# Patient Record
Sex: Male | Born: 2000 | Race: Black or African American | Hispanic: No | Marital: Single | Smoking: Never smoker
Health system: Southern US, Community
[De-identification: ages and names within clinical notes are randomized; demographics above are authoritative.]

---
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
## 2019-03-30 ENCOUNTER — Other Ambulatory Visit: Payer: Self-pay

## 2019-03-30 ENCOUNTER — Encounter (HOSPITAL_COMMUNITY): Payer: Self-pay | Admitting: Emergency Medicine

## 2019-03-30 ENCOUNTER — Emergency Department (HOSPITAL_COMMUNITY)
Admission: EM | Admit: 2019-03-30 | Discharge: 2019-03-30 | Disposition: A | Discharge status: Home / Self Care | Payer: 59 | Attending: Emergency Medicine | Admitting: Emergency Medicine

## 2019-03-30 DIAGNOSIS — S01112A Laceration without foreign body of left eyelid and periocular area, initial encounter: Secondary | ICD-10-CM | POA: Diagnosis not present

## 2019-03-30 DIAGNOSIS — W503XXA Accidental bite by another person, initial encounter: Secondary | ICD-10-CM

## 2019-03-30 DIAGNOSIS — F17220 Nicotine dependence, chewing tobacco, uncomplicated: Secondary | ICD-10-CM | POA: Insufficient documentation

## 2019-03-30 DIAGNOSIS — Y929 Unspecified place or not applicable: Secondary | ICD-10-CM | POA: Diagnosis not present

## 2019-03-30 DIAGNOSIS — Z23 Encounter for immunization: Secondary | ICD-10-CM | POA: Insufficient documentation

## 2019-03-30 DIAGNOSIS — Y999 Unspecified external cause status: Secondary | ICD-10-CM | POA: Diagnosis not present

## 2019-03-30 DIAGNOSIS — Y939 Activity, unspecified: Secondary | ICD-10-CM | POA: Diagnosis not present

## 2019-03-30 DIAGNOSIS — S0993XA Unspecified injury of face, initial encounter: Secondary | ICD-10-CM | POA: Diagnosis present

## 2019-03-30 DIAGNOSIS — S0181XA Laceration without foreign body of other part of head, initial encounter: Secondary | ICD-10-CM

## 2019-03-30 MED ORDER — TETANUS-DIPHTH-ACELL PERTUSSIS 5-2.5-18.5 LF-MCG/0.5 IM SUSP
0.5000 mL | Freq: Once | INTRAMUSCULAR | Status: AC
  Administered 2019-03-30: 07:00:00 0.5 mL via INTRAMUSCULAR
  Filled 2019-03-30: qty 0.5

## 2019-03-30 MED ORDER — AMOXICILLIN-POT CLAVULANATE 875-125 MG PO TABS: 1.0000 | ORAL_TABLET | Freq: Two times a day (BID) | ORAL | 0 refills | Status: DC

## 2019-03-30 MED ORDER — LIDOCAINE-EPINEPHRINE (PF) 2 %-1:200000 IJ SOLN
20.0000 mL | Freq: Once | INTRAMUSCULAR | Status: AC
  Administered 2019-03-30: 10 mL via INTRADERMAL
  Filled 2019-03-30: qty 20

## 2019-03-30 NOTE — ED Notes (Signed)
Pt wants to stay in personal clothes and not change into gown. Pt placed on cardiac monitoring due to heart rate.
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
## 2019-03-30 NOTE — ED Notes (Signed)
ED Provider at bedside. 

## 2019-03-30 NOTE — Discharge Instructions (Signed)
It was my pleasure taking care of you today!   Please take all of your antibiotics until finished!  For your laceration, these stitches should dissolve on their own.  If they do not, you can take a warm washcloth and some Vaseline to gently scrub the area to remove suturing.  Watch for signs of infection such as surrounding redness or drainage from the wound.  If this occurs, see her primary care doctor or return to the emergency department.  For your bite wound, keep clean, washing with soap and water several times a day.  Ice to help with the swelling.

## 2021-11-29 ENCOUNTER — Other Ambulatory Visit: Payer: Self-pay

## 2021-11-29 ENCOUNTER — Emergency Department (HOSPITAL_COMMUNITY): Payer: Managed Care, Other (non HMO)

## 2021-11-29 ENCOUNTER — Emergency Department (HOSPITAL_COMMUNITY)
Admission: EM | Admit: 2021-11-29 | Discharge: 2021-11-29 | Disposition: A | Discharge status: Home / Self Care | Payer: Managed Care, Other (non HMO) | Attending: Emergency Medicine | Admitting: Emergency Medicine

## 2021-11-29 ENCOUNTER — Encounter (HOSPITAL_COMMUNITY): Payer: Self-pay

## 2021-11-29 DIAGNOSIS — M79641 Pain in right hand: Secondary | ICD-10-CM | POA: Diagnosis not present

## 2021-11-29 DIAGNOSIS — R030 Elevated blood-pressure reading, without diagnosis of hypertension: Secondary | ICD-10-CM | POA: Diagnosis not present

## 2021-11-29 DIAGNOSIS — M25531 Pain in right wrist: Secondary | ICD-10-CM | POA: Insufficient documentation

## 2021-11-29 DIAGNOSIS — W2189XA Striking against or struck by other sports equipment, initial encounter: Secondary | ICD-10-CM | POA: Insufficient documentation

## 2021-11-29 NOTE — ED Provider Notes (Signed)
?[facility] DEPT ?Provider Note ? ? ?CSN: [id number] ?Arrival date & time: 11/29/21  0121 ? ?  ? ?History ? ?Chief Complaint  ?Patient presents with  ? Hand Pain  ? Wrist Injury  ? ? ?Garrett Hess is a 21 y.o. male with no past medical history who presents today for evaluation of a right hand injury. ?He states that he was punching a punching dummy to blow off some steam after work and injured his right hand and wrist.  He initially did not think his injury was significant however as the night is gone on his pain is increased.  He denies any other injuries. ? ?HPI ? ?  ? ?Home Medications ?Prior to Admission medications   ?Not on File  ?   ? ?Allergies    ?Patient has no known allergies.   ? ?Review of Systems   ?Review of Systems ? ?Physical Exam ?Updated Vital Signs ?BP (!) 165/93 (BP Location: Left Arm)   Pulse 82   Temp 98 ?F (36.7 ?C) (Oral)   Resp 17   Ht 5\' 9"  (1.753 m)   Wt 74.8 kg   SpO2 100%   BMI 24.37 kg/m?  ?Physical Exam ?Vitals and nursing note reviewed.  ?Constitutional:   ?   General: He is not in acute distress. ?HENT:  ?   Head: Normocephalic and atraumatic.  ?Cardiovascular:  ?   Rate and Rhythm: Normal rate.  ?   Comments: Capillary refill to all fingers on right hand is under 2 seconds.  2+ right radial pulse. ?Pulmonary:  ?   Effort: Pulmonary effort is normal. No respiratory distress.  ?Musculoskeletal:  ?   Cervical back: No rigidity.  ?   Comments: Mild edema over the dorsum of the right wrist.  There is no discrete tenderness to palpation over the right anatomic snuffbox.  Compartments in the right forearm and arm are soft and easily compressible.  Patient has full active range of motion of all fingers on the right hand.  Range of motion of the right wrist is limited secondary to pain.  ?Neurological:  ?   Mental Status: He is alert. Mental status is at baseline.  ?   Comments: Awake and alert, answers all questions appropriately.  Speech is not  slurred.    ?Psychiatric:     ?   Mood and Affect: Mood normal.  ? ? ?ED Results / Procedures / Treatments   ?Labs ?(all labs ordered are listed, but only abnormal results are displayed) ?Labs Reviewed - No data to display ? ?EKG ?None ? ?Radiology ?DG Wrist Complete Right ? ?Result Date: 11/29/2021 ?CLINICAL DATA:  Injured hand hitting punching bag EXAM: RIGHT WRIST - COMPLETE 3+ VIEW COMPARISON:  Hand series today FINDINGS: There is no evidence of fracture or dislocation. There is no evidence of arthropathy or other focal bone abnormality. Soft tissues are unremarkable. IMPRESSION: Negative. Electronically Signed   By: Rolm Baptise M.D.   On: 11/29/2021 02:55  ? ?DG Hand Complete Right ? ?Result Date: 11/29/2021 ?CLINICAL DATA:  Injury hitting punching bag EXAM: RIGHT HAND - COMPLETE 3+ VIEW COMPARISON:  Wrist series today FINDINGS: There is no evidence of fracture or dislocation. There is no evidence of arthropathy or other focal bone abnormality. Soft tissues are unremarkable. IMPRESSION: Negative. Electronically Signed   By: Rolm Baptise M.D.   On: 11/29/2021 02:55   ? ?Procedures ?Procedures  ? ? ?Medications Ordered in ED ?Medications - No data to display ? ?  ED Course/ Medical Decision Making/ A&P ?  ?                        ?Medical Decision Making ?Patient is a 20 year old who presents today for evaluation of a isolated injury to his right hand and wrist.  This is from punching a punching bag per his report. ?On exam he has mild edema.  X-rays of the right hand and wrist were obtained without fracture or other acute abnormalities. ?He is neurovascularly intact on my exam. ?I discussed treatment options with patient. ?Recommended OTC medications as needed for pain.  Follow-up with hand if symptoms do not begin to improve in 1 week.  Ace wrap given for pain. ? ?Of note patient's blood pressure was initially low and then high while in the emergency room.  He is not having any symptoms related to this and states  that normally when he goes to the doctor his blood pressure is elevated.  He does not have a primary care doctor currently. ?I recommended that he obtain 1 and get his blood pressure rechecked in the next month.  He states his understanding. ? ?Return precautions were discussed with patient who states their understanding.  At the time of discharge patient denied any unaddressed complaints or concerns.  Patient is agreeable for discharge home. ? ?Note: Portions of this report may have been transcribed using voice recognition software. Every effort was made to ensure accuracy; however, inadvertent computerized transcription errors may be present ? ? ? ?Amount and/or Complexity of Data Reviewed ?Radiology: ordered. ? ?Risk ?OTC drugs. ?Decision regarding hospitalization. ? ? ? ? ? ? ? ? ? ? ?Final Clinical Impression(s) / ED Diagnoses ?Final diagnoses:  ?Right hand pain  ?Right wrist pain  ?Elevated blood pressure reading  ? ? ?Rx / DC Orders ?ED Discharge Orders   ? ? None  ? ?  ? ? ?  ?Lorin Glass, [place] ?11/29/21 [id number] ? ?  Veatrice Kells, MD ?11/29/21 [id number] ? ?

## 2021-11-29 NOTE — ED Triage Notes (Signed)
Pt punched a punching dummy and injured his right wrist. Pt complains of pain in the wrist and up the right side of the hand.  ?

## 2021-11-29 NOTE — Discharge Instructions (Addendum)
Today your x-rays did not show any broken bones in your wrist or hand. ?If you do not begin having improvement in the next week please follow-up with the hand specialist. ? ?Please take Ibuprofen (Advil, motrin) and Tylenol (acetaminophen) to relieve your pain.   ? ?You may take up to 600 MG (3 pills) of normal strength ibuprofen every 8 hours as needed.   ?You make take tylenol, up to 1,000 mg (two extra strength pills) every 8 hours as needed.  ? ?It is safe to take ibuprofen and tylenol at the same time as they work differently.  ? Do not take more than 3,000 mg tylenol in a 24 hour period (not more than one dose every 8 hours.  Please check all medication labels as many medications such as pain and cold medications may contain tylenol.  Do not drink alcohol while taking these medications.  Do not take other NSAID'S while taking ibuprofen (such as aleve or naproxen).  Please take ibuprofen with food to decrease stomach upset. ? ?While in the ED your blood pressure was high.  Please follow up with your primary care doctor or the wellness clinic for repeat evaluation as you may need medication.  High blood pressure can cause long term, potentially serious, damage if left untreated.  ? ?

## 2023-11-01 IMAGING — CR DG WRIST COMPLETE 3+V*R*
4 series · 4 of 4 positions shown · non-contrast
Comparison: Hand series today

CLINICAL DATA: Injured hand hitting punching bag

EXAM:
RIGHT WRIST - COMPLETE 3+ VIEW

[x wrist obl right]
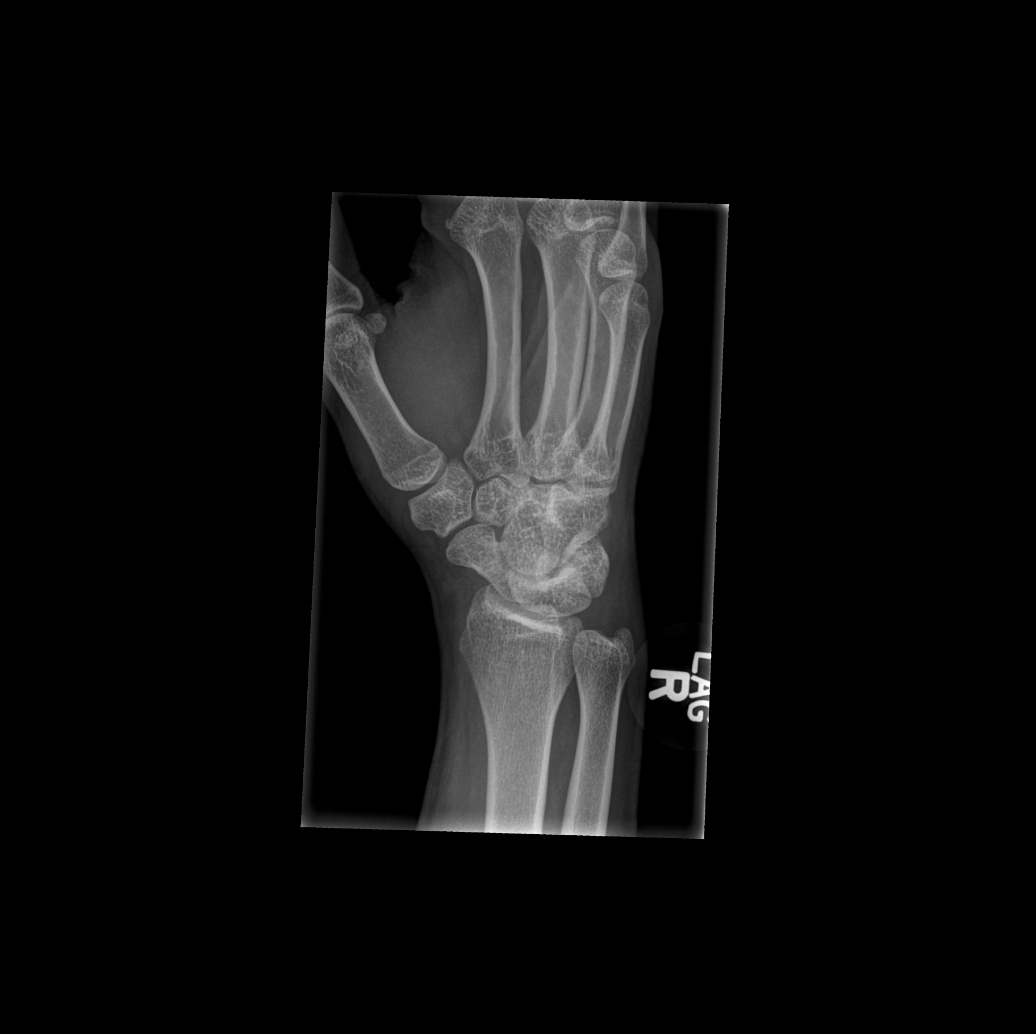

[x wrist lat right]
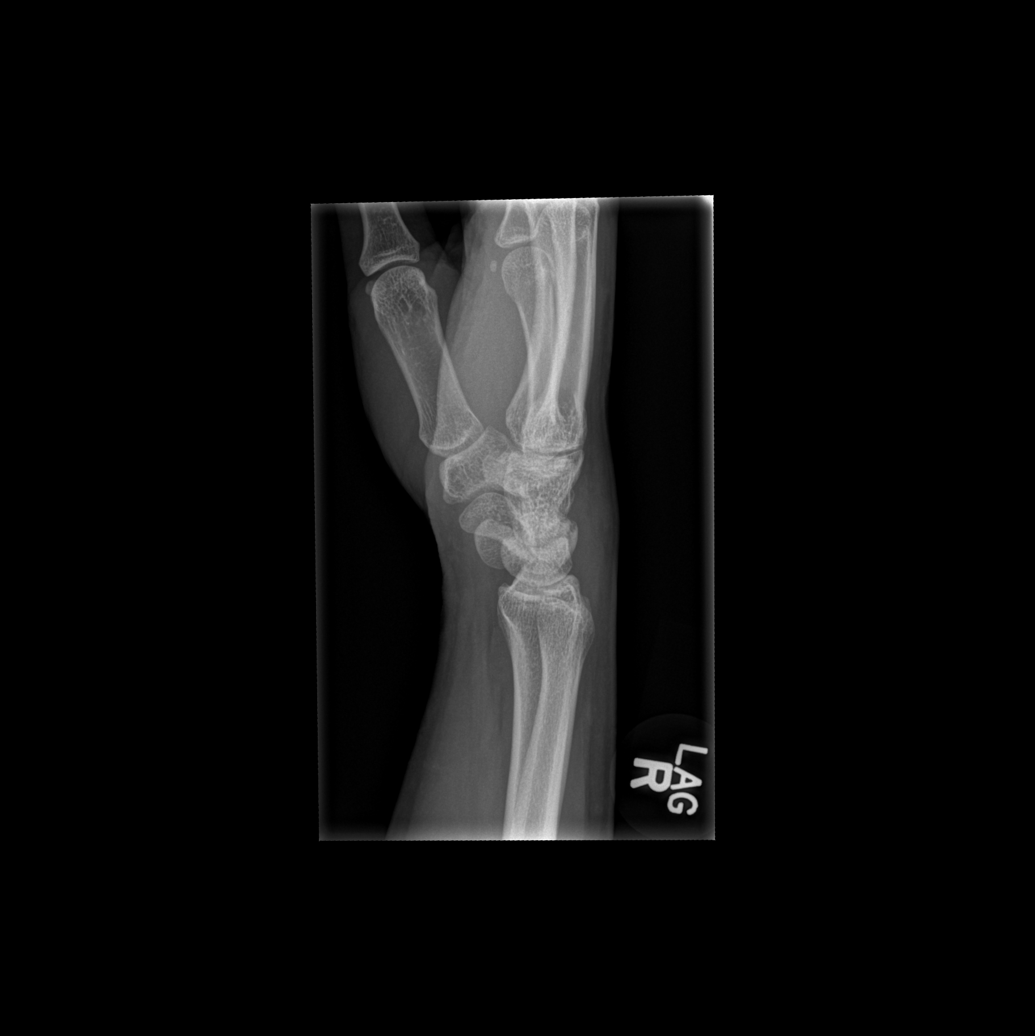

[x wrist pa right]
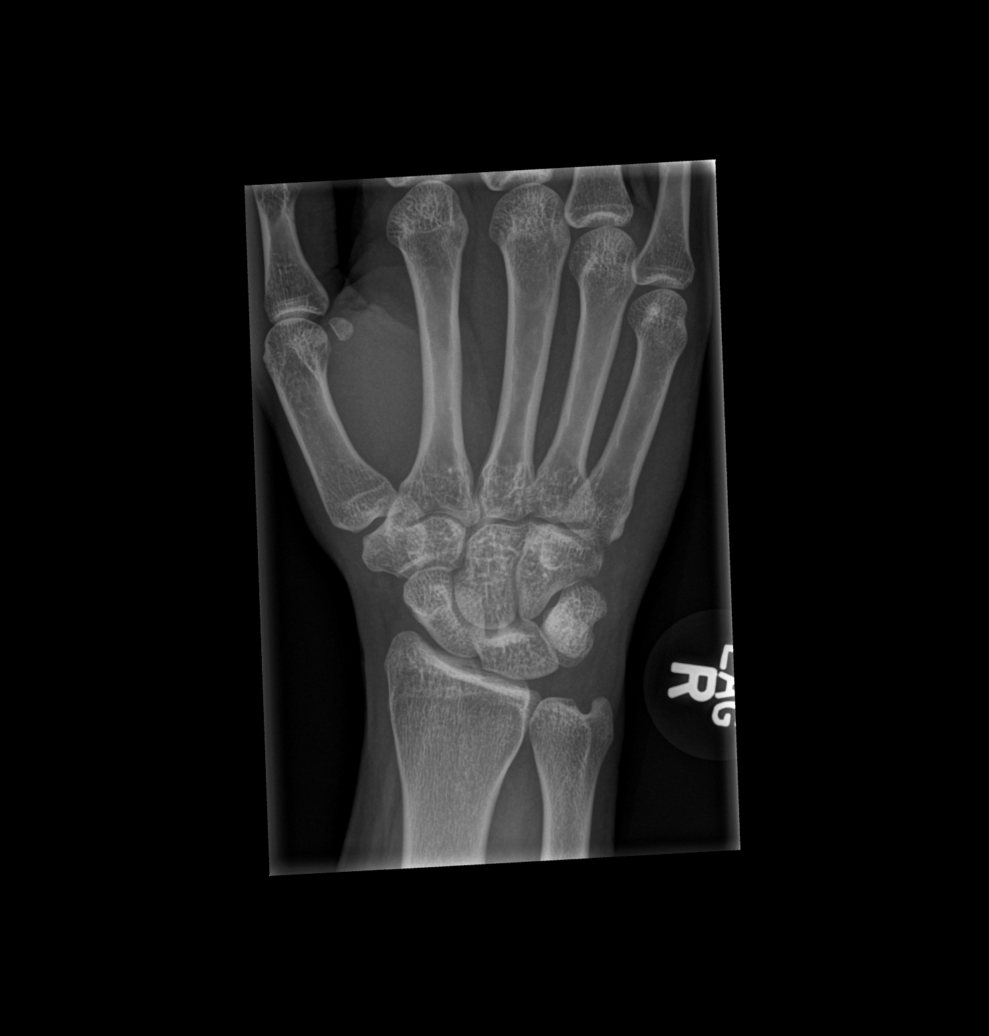

[x wrist navicular view right]
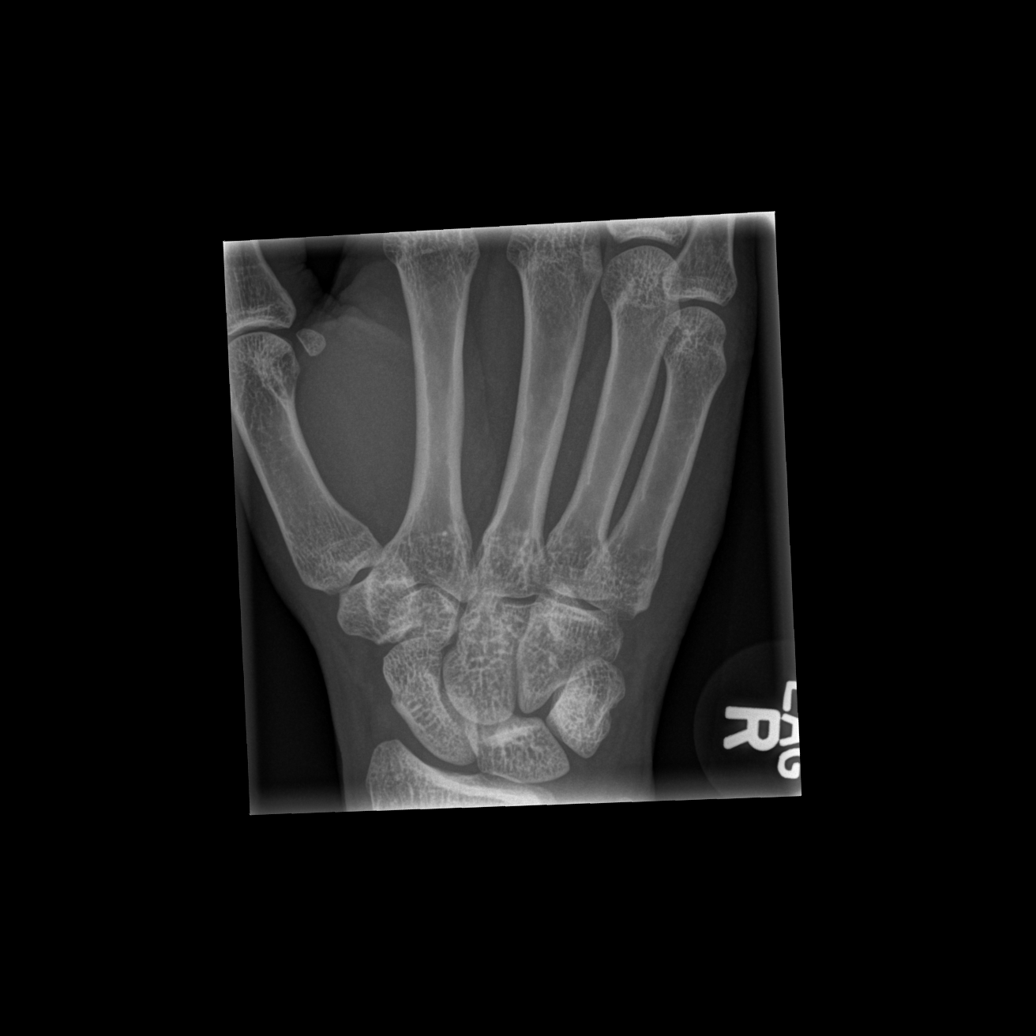

[4 of 4 positions shown; findings below may reference images not displayed]

FINDINGS: There is no evidence of fracture or dislocation. There is no
evidence of arthropathy or other focal bone abnormality. Soft
tissues are unremarkable.
IMPRESSION: Negative.
